# Patient Record
Sex: Male | Born: 1955 | Race: White | Hispanic: No | Marital: Married | Smoking: Former smoker
Health system: Southern US, Community
[De-identification: ages and names within clinical notes are randomized; demographics above are authoritative.]

## PROBLEM LIST (undated history)

## (undated) DIAGNOSIS — G510 Bell's palsy: Secondary | ICD-10-CM

## (undated) DIAGNOSIS — E7800 Pure hypercholesterolemia, unspecified: Secondary | ICD-10-CM

## (undated) DIAGNOSIS — I10 Essential (primary) hypertension: Secondary | ICD-10-CM

## (undated) DIAGNOSIS — K219 Gastro-esophageal reflux disease without esophagitis: Secondary | ICD-10-CM

## (undated) HISTORY — PX: SHOULDER SURGERY: SHX246

## (undated) HISTORY — PX: NASAL SINUS SURGERY: SHX719

## (undated) HISTORY — PX: INGUINAL HERNIA REPAIR: SHX194

---

## 2016-07-15 ENCOUNTER — Encounter (HOSPITAL_COMMUNITY): Payer: Self-pay | Admitting: Emergency Medicine

## 2016-07-15 ENCOUNTER — Observation Stay (HOSPITAL_COMMUNITY)
Admission: EM | Admit: 2016-07-15 | Discharge: 2016-07-16 | Disposition: A | Discharge status: Home / Self Care | Payer: BC Managed Care – PPO | Attending: Internal Medicine | Admitting: Internal Medicine

## 2016-07-15 ENCOUNTER — Emergency Department (HOSPITAL_COMMUNITY): Payer: BC Managed Care – PPO

## 2016-07-15 ENCOUNTER — Ambulatory Visit (HOSPITAL_COMMUNITY)
Admission: EM | Admit: 2016-07-15 | Discharge: 2016-07-15 | Disposition: A | Discharge status: Home / Self Care | Payer: BC Managed Care – PPO | Source: Home / Self Care | Attending: Family Medicine | Admitting: Family Medicine

## 2016-07-15 DIAGNOSIS — Z87891 Personal history of nicotine dependence: Secondary | ICD-10-CM | POA: Diagnosis not present

## 2016-07-15 DIAGNOSIS — R0789 Other chest pain: Secondary | ICD-10-CM | POA: Diagnosis present

## 2016-07-15 DIAGNOSIS — R079 Chest pain, unspecified: Secondary | ICD-10-CM | POA: Diagnosis not present

## 2016-07-15 DIAGNOSIS — N289 Disorder of kidney and ureter, unspecified: Secondary | ICD-10-CM | POA: Diagnosis not present

## 2016-07-15 DIAGNOSIS — I160 Hypertensive urgency: Secondary | ICD-10-CM | POA: Diagnosis present

## 2016-07-15 DIAGNOSIS — H9202 Otalgia, left ear: Secondary | ICD-10-CM | POA: Diagnosis not present

## 2016-07-15 DIAGNOSIS — E785 Hyperlipidemia, unspecified: Secondary | ICD-10-CM | POA: Diagnosis not present

## 2016-07-15 DIAGNOSIS — Z7982 Long term (current) use of aspirin: Secondary | ICD-10-CM | POA: Diagnosis not present

## 2016-07-15 DIAGNOSIS — Z79899 Other long term (current) drug therapy: Secondary | ICD-10-CM | POA: Diagnosis not present

## 2016-07-15 DIAGNOSIS — I10 Essential (primary) hypertension: Secondary | ICD-10-CM | POA: Diagnosis present

## 2016-07-15 HISTORY — DX: Bell's palsy: G51.0

## 2016-07-15 HISTORY — DX: Pure hypercholesterolemia, unspecified: E78.00

## 2016-07-15 HISTORY — DX: Essential (primary) hypertension: I10

## 2016-07-15 HISTORY — DX: Gastro-esophageal reflux disease without esophagitis: K21.9

## 2016-07-15 LAB — CBC
HCT: 40.5 % (ref 39.0–52.0)
HEMOGLOBIN: 13.5 g/dL (ref 13.0–17.0)
MCH: 31.3 pg (ref 26.0–34.0)
MCHC: 33.3 g/dL (ref 30.0–36.0)
MCV: 93.8 fL (ref 78.0–100.0)
PLATELETS: 233 10*3/uL (ref 150–400)
RBC: 4.32 MIL/uL (ref 4.22–5.81)
RDW: 12.9 % (ref 11.5–15.5)
WBC: 9 10*3/uL (ref 4.0–10.5)

## 2016-07-15 LAB — BASIC METABOLIC PANEL
Anion gap: 9 (ref 5–15)
BUN: 26 mg/dL — ABNORMAL HIGH (ref 6–20)
CHLORIDE: 100 mmol/L — AB (ref 101–111)
CO2: 25 mmol/L (ref 22–32)
Calcium: 8.9 mg/dL (ref 8.9–10.3)
Creatinine, Ser: 1.62 mg/dL — ABNORMAL HIGH (ref 0.61–1.24)
GFR calc Af Amer: 51 mL/min — ABNORMAL LOW (ref >60)
GFR calc non Af Amer: 44 mL/min — ABNORMAL LOW (ref >60)
GLUCOSE: 108 mg/dL — AB (ref 65–99)
Potassium: 3.8 mmol/L (ref 3.5–5.1)
Sodium: 134 mmol/L — ABNORMAL LOW (ref 135–145)

## 2016-07-15 LAB — TROPONIN I: Troponin I: <0.03 ng/mL (ref <0.03)

## 2016-07-15 LAB — I-STAT TROPONIN, ED: TROPONIN I, POC: 0 ng/mL (ref 0.00–0.08)

## 2016-07-15 MED ORDER — GI COCKTAIL ~~LOC~~: 30.0000 mL | Freq: Three times a day (TID) | ORAL | Status: DC | PRN

## 2016-07-15 MED ORDER — ONDANSETRON HCL 4 MG/2ML IJ SOLN: 4.0000 mg | Freq: Four times a day (QID) | INTRAMUSCULAR | Status: DC | PRN

## 2016-07-15 MED ORDER — ACETAMINOPHEN 325 MG PO TABS
650.0000 mg | ORAL_TABLET | ORAL | Status: DC | PRN
  Administered 2016-07-15: 650 mg via ORAL
  Filled 2016-07-15: qty 2

## 2016-07-15 MED ORDER — LABETALOL HCL 5 MG/ML IV SOLN: 5.0000 mg | INTRAVENOUS | Status: DC | PRN

## 2016-07-15 MED ORDER — MORPHINE SULFATE (PF) 4 MG/ML IV SOLN: 2.0000 mg | INTRAVENOUS | Status: DC | PRN

## 2016-07-15 MED ORDER — ENOXAPARIN SODIUM 40 MG/0.4ML ~~LOC~~ SOLN
40.0000 mg | SUBCUTANEOUS | Status: DC
  Administered 2016-07-15: 40 mg via SUBCUTANEOUS
  Filled 2016-07-15: qty 0.4

## 2016-07-15 MED ORDER — LISINOPRIL 40 MG PO TABS
40.0000 mg | ORAL_TABLET | Freq: Every day | ORAL | Status: DC
  Administered 2016-07-16: 40 mg via ORAL
  Filled 2016-07-15: qty 1

## 2016-07-15 MED ORDER — ONDANSETRON HCL 4 MG/2ML IJ SOLN
4.0000 mg | Freq: Four times a day (QID) | INTRAMUSCULAR | Status: DC | PRN
Start: 2016-07-15 — End: 2016-07-15

## 2016-07-15 MED ORDER — NITROGLYCERIN 0.4 MG SL SUBL: 0.4000 mg | SUBLINGUAL_TABLET | SUBLINGUAL | Status: DC | PRN

## 2016-07-15 MED ORDER — HYDROCODONE-ACETAMINOPHEN 5-325 MG PO TABS: 1.0000 | ORAL_TABLET | ORAL | Status: DC | PRN

## 2016-07-15 MED ORDER — ASPIRIN EC 81 MG PO TBEC
81.0000 mg | DELAYED_RELEASE_TABLET | Freq: Every day | ORAL | Status: DC
  Administered 2016-07-16: 81 mg via ORAL
  Filled 2016-07-15: qty 1

## 2016-07-15 MED ORDER — SODIUM CHLORIDE 0.9 % IV BOLUS (SEPSIS)
500.0000 mL | Freq: Once | INTRAVENOUS | Status: AC
  Administered 2016-07-15: 500 mL via INTRAVENOUS

## 2016-07-15 MED ORDER — HYDROCHLOROTHIAZIDE 25 MG PO TABS
25.0000 mg | ORAL_TABLET | Freq: Every day | ORAL | Status: DC
  Administered 2016-07-16: 25 mg via ORAL
  Filled 2016-07-15: qty 1

## 2016-07-15 MED ORDER — ACETAMINOPHEN 325 MG PO TABS: 650.0000 mg | ORAL_TABLET | Freq: Four times a day (QID) | ORAL | Status: DC | PRN

## 2016-07-15 MED ORDER — SODIUM CHLORIDE 0.9 % IV SOLN
INTRAVENOUS | Status: AC
  Administered 2016-07-15: 23:00:00 via INTRAVENOUS

## 2016-07-15 MED ORDER — EZETIMIBE 10 MG PO TABS
10.0000 mg | ORAL_TABLET | Freq: Every day | ORAL | Status: DC
  Administered 2016-07-16: 10 mg via ORAL
  Filled 2016-07-15: qty 1

## 2016-07-15 MED ORDER — FLUTICASONE PROPIONATE 50 MCG/ACT NA SUSP
1.0000 | NASAL | Status: DC | PRN
  Filled 2016-07-15: qty 16

## 2016-07-15 NOTE — ED Provider Notes (Signed)
[facility]-EMERGENCY DEPT Provider Note   CSN: [id number] Arrival date & time: 07/15/16  1753     History   Chief Complaint Chief Complaint  Patient presents with  . Chest Pain    HPI Earl Bryant is a 61 y.o. male.  Patient with obesity, high blood pressure, cholesterol history presents from urgent care for intermittent left jaw pain and chest pressure for the past week. Patient initially thought it was just due to wax in ears. Patient is had chest pressure since this morning and increased flow dyspnea/fatigue. Patient does work in Aeronautical engineer. No specific exertional chest pain. Patient does not have any known cardiac history. Patient has noticed more frequent events similar. No stress test recently. Patient had 6 baby aspirins today.      Past Medical History:  Diagnosis Date  . Acid reflux   . Bell's palsy   . High cholesterol   . Hypertension     There are no active problems to display for this patient.   Past Surgical History:  Procedure Laterality Date  . INGUINAL HERNIA REPAIR    . NASAL SINUS SURGERY    . SHOULDER SURGERY         Home Medications    Prior to Admission medications   Medication Sig Start Date End Date Taking? Authorizing Provider  aspirin 81 MG tablet Take 81 mg by mouth daily.   Yes [provider]  esomeprazole (NEXIUM) 20 MG capsule Take 20 mg by mouth as needed.    Yes [provider]  ezetimibe (ZETIA) 10 MG tablet Take 10 mg by mouth daily.   Yes [provider]  fluticasone (FLONASE) 50 MCG/ACT nasal spray Place 1 spray into both nostrils as needed for allergies or rhinitis.   Yes [provider]  hydrochlorothiazide (HYDRODIURIL) 25 MG tablet Take 25 mg by mouth daily.   Yes [provider]  LISINOPRIL PO Take 80 mg by mouth daily.    Yes [provider]  metroNIDAZOLE (METROGEL) 1 % gel Apply 1 application topically as needed.   Yes [provider]    Pseudoephedrine-Ibuprofen (ADVIL COLD & SINUS LIQUI-GELS PO) Take by mouth.   Yes [provider]  TADALAFIL PO Take 25 mg by mouth as needed.    Yes [provider]    Family History No family history on file.  Social History Social History  Substance Use Topics  . Smoking status: Former Games developer  . Smokeless tobacco: Never Used  . Alcohol use Yes     Comment: socially     Allergies   Patient has no known allergies.   Review of Systems Review of Systems  Constitutional: Positive for fatigue. Negative for chills and fever.  HENT: Negative for congestion.   Eyes: Negative for visual disturbance.  Respiratory: Negative for shortness of breath.   Cardiovascular: Positive for chest pain.  Gastrointestinal: Negative for abdominal pain and vomiting.  Genitourinary: Negative for dysuria and flank pain.  Musculoskeletal: Negative for back pain, neck pain and neck stiffness.  Skin: Negative for rash.  Neurological: Negative for light-headedness and headaches.     Physical Exam Updated Vital Signs BP (!) 170/103 (BP Location: Right Arm)   Pulse 91   Temp 97.6 F (36.4 C) (Oral)   Resp 18   Ht 6\' 1"  (1.854 m)   Wt 111.1 kg (245 lb)   SpO2 97%   BMI 32.32 kg/m   Physical Exam  Constitutional: He is oriented to person, place, and time.  He appears well-developed and well-nourished.  HENT:  Head: Normocephalic and atraumatic.  Eyes: Conjunctivae are normal. Right eye exhibits no discharge. Left eye exhibits no discharge.  Neck: Normal range of motion. Neck supple. No tracheal deviation present.  Cardiovascular: Normal rate, regular rhythm and intact distal pulses.   Pulmonary/Chest: Effort normal and breath sounds normal.  Abdominal: Soft. He exhibits no distension. There is no tenderness. There is no guarding.  Musculoskeletal: He exhibits no edema.  Neurological: He is alert and oriented to person, place, and time.  Skin: Skin is warm. No rash noted.   Psychiatric: He has a normal mood and affect.  Nursing note and vitals reviewed.    ED Treatments / Results  Labs (all labs ordered are listed, but only abnormal results are displayed) Labs Reviewed  BASIC METABOLIC PANEL - Abnormal; Notable for the following:       Result Value   Sodium 134 (*)    Chloride 100 (*)    Glucose, Bld 108 (*)    BUN 26 (*)    Creatinine, Ser 1.62 (*)    GFR calc non Af Amer 44 (*)    GFR calc Af Amer 51 (*)    All other components within normal limits  CBC  I-STAT TROPOININ, ED    EKG  EKG Interpretation None       Radiology Dg Chest 2 View  Result Date: 07/15/2016 CLINICAL DATA:  Intermittent pain at LEFT-sided jaw into LEFT neck and LEFT-sided chest beginning 2 weeks ago, hypertension, former smoker EXAM: CHEST  2 VIEW COMPARISON:  10/11/2007 FINDINGS: Normal heart size, mediastinal contours, and pulmonary vascularity. Bronchitic changes with minimal RIGHT basilar atelectasis. Lungs otherwise clear. No acute infiltrate, pleural effusion or pneumothorax. Bones unremarkable. IMPRESSION: Bronchitic changes with RIGHT basilar atelectasis. Electronically Signed   By: Ulyses Southward M.D.   On: 07/15/2016 18:27    Procedures Procedures (including critical care time)  Medications Ordered in ED Medications  sodium chloride 0.9 % bolus 500 mL (not administered)     Initial Impression / Assessment and Plan / ED Course  I have reviewed the triage vital signs and the nursing notes.  Pertinent labs & imaging results that were available during my care of the patient were reviewed by me and considered in my medical decision making (see chart for details).    Patient presents with intermittent chest pressure and left jaw symptoms. Patient has multiple risk factors for coronary artery disease. Discussed observation in the hospital for likely stress test versus close outpatient follow-up. We agreed on observation.  The patients results and plan were  reviewed and discussed.   Any x-rays performed were independently reviewed by myself.   Differential diagnosis were considered with the presenting HPI.  Medications  0.9 %  sodium chloride infusion ( Intravenous Rate/Dose Verify 07/16/16 0527)  sodium chloride 0.9 % bolus 500 mL (0 mLs Intravenous Stopped 07/15/16 2100)  technetium tetrofosmin (TC-MYOVIEW) injection 10 millicurie (10 millicuries Intravenous Contrast Given 07/16/16 1202)  technetium tetrofosmin (TC-MYOVIEW) injection 30 millicurie (30 millicuries Intravenous Contrast Given 07/16/16 1306)    Vitals:   07/15/16 2100 07/16/16 0612 07/16/16 0945 07/16/16 1446  BP: (!) 153/82 (!) 152/89 140/83 131/88  Pulse: 88 63  80  Resp: 18 16  18   Temp: 98.2 F (36.8 C) 97.5 F (36.4 C)  98.4 F (36.9 C)  TempSrc: Oral Oral  Oral  SpO2: 100% 98%  100%  Weight: 111.9 kg (246 lb 12.8 oz) 111.8 kg (246  lb 6.4 oz)    Height: 6\' 1"  (1.854 m)       Final diagnoses:  Acute chest pain    Admission/ observation were discussed with the admitting physician, patient and/or family and they are comfortable with the plan.    Final Clinical Impressions(s) / ED Diagnoses   Final diagnoses:  Acute chest pain    New Prescriptions New Prescriptions   No medications on file     Blane OharaZavitz, Zuhair Lariccia, MD 07/18/16 [phone]

## 2016-07-15 NOTE — ED Notes (Signed)
Dinner tray ordered; heart healthy diet 

## 2016-07-15 NOTE — ED Triage Notes (Signed)
Pt sent by urgent care for evaluation of chest pain radiating to left jaw and left ear. Pt states pain has been intermittent for "a few weeks". Pt states he normally has excessive wax in ears that sometimes causes these symptoms.

## 2016-07-15 NOTE — H&P (Signed)
History and Physical    Earl Bryant ZOX:[id number] DOB: Sep 01, 1955 DOA: 07/15/2016  PCP: Lindwood Qua, MD   Patient coming from: Home, by way of urgent care  Chief Complaint: Left ear and jaw ache, chest pain   HPI: Earl Bryant is a 60 y.o. male with medical history significant for hypertension, hyperlipidemia, and history of tobacco abuse, now presenting to the emergency department for evaluation of left ear and jaw ache with chest discomfort. Patient reports that he bit his usual state of health until one to 2 weeks ago when he noted the insidious development of a dull ache in his left ear and left jaw. He reports experiencing similar symptoms previously admitted been attributed to excessive cerumen, but has also begun to develop chest discomfort associated with the jaw ache. He has been unable to identify any alleviating or exacerbating factors for these symptoms, but notes the intermittent and recurring development of a dull ache in the left jaw, moderate in intensity, and nonradiating. The chest discomfort is also described as dull, aching, localized to the mid-left chest, and moderate in intensity. He went to urgent care for evaluation of this, but was directed over to the ED for evaluation of potential ACS. Patient reports taking 6 tabs of aspirin 81 mg today. He denies cough or dyspnea. Denies fevers or chills. Reports occasional indigestion for which she takes Nexium as needed, but notes that his current symptoms are different than the experiences with indigestion.  ED Course: Upon arrival to the ED, patient is found to be afebrile, saturating well on room air, hypertensive to 170/103, and with vitals otherwise stable. EKG features a sinus rhythm with PVC and chest x-ray is notable for bronchitic changes with right base atelectasis. Chemistry panel is notable for slight hyponatremia, BUN of 26, and serum creatinine 1.6 to with unknown baseline. CBC is unremarkable and troponin is  undetectable. Patient was treated with 500 mL normal saline in the ED and remained stable. He will be observed on the telemetry unit for ongoing evaluation and management of left jaw and chest discomfort in a patient with risk factors for CAD.  Review of Systems:  All other systems reviewed and apart from HPI, are negative.  Past Medical History:  Diagnosis Date  . Acid reflux   . Bell's palsy   . High cholesterol   . Hypertension     Past Surgical History:  Procedure Laterality Date  . INGUINAL HERNIA REPAIR    . NASAL SINUS SURGERY    . SHOULDER SURGERY       reports that he has quit smoking. His smoking use included Cigarettes. He has never used smokeless tobacco. He reports that he drinks alcohol. He reports that he does not use drugs.  No Known Allergies  Family History  Problem Relation Age of Onset  . Hypertension Mother   . Heart attack Father   . Hypertension Sister      Prior to Admission medications   Medication Sig Start Date End Date Taking? Authorizing Provider  aspirin 81 MG tablet Take 81 mg by mouth daily.   Yes [provider]  esomeprazole (NEXIUM) 20 MG capsule Take 20 mg by mouth as needed.    Yes [provider]  ezetimibe (ZETIA) 10 MG tablet Take 10 mg by mouth daily.   Yes [provider]  fluticasone (FLONASE) 50 MCG/ACT nasal spray Place 1 spray into both nostrils as needed for allergies or rhinitis.   Yes [provider]  hydrochlorothiazide (  HYDRODIURIL) 25 MG tablet Take 25 mg by mouth daily.   Yes [provider]  LISINOPRIL PO Take 80 mg by mouth daily.    Yes [provider]  metroNIDAZOLE (METROGEL) 1 % gel Apply 1 application topically as needed.   Yes [provider]  Pseudoephedrine-Ibuprofen (ADVIL COLD & SINUS LIQUI-GELS PO) Take by mouth.   Yes [provider]  TADALAFIL PO Take 25 mg by mouth as needed.    Yes [provider]    Physical Exam: Vitals:    07/15/16 1802 07/15/16 2045  BP: (!) 170/103 (!) 159/90  Pulse: 91 75  Resp: 18   Temp: 97.6 F (36.4 C)   TempSrc: Oral   SpO2: 97% 99%  Weight: 111.1 kg (245 lb)   Height: 6\' 1"  (1.854 m)       Constitutional: NAD, calm, comfortable Eyes: PERTLA, lids and conjunctivae normal ENMT: Mucous membranes are moist. Posterior pharynx clear of any exudate or lesions.   Neck: normal, supple, no masses, no thyromegaly Respiratory: clear to auscultation bilaterally, no wheezing, no crackles. Normal respiratory effort.   Cardiovascular: S1 & S2 heard, regular rate and rhythm. No extremity edema. No significant JVD. Abdomen: No distension, no tenderness, no masses palpated. Bowel sounds normal.  Musculoskeletal: no clubbing / cyanosis. No joint deformity upper and lower extremities.    Skin: no significant rashes, lesions, ulcers. Warm, dry, well-perfused. Neurologic: CN 2-12 grossly intact. Sensation intact, DTR normal. Strength 5/5 in all 4 limbs.  Psychiatric: Alert and oriented x 3. Pleasant and cooperative.     Labs on Admission: I have personally reviewed following labs and imaging studies  CBC:  Recent Labs Lab 07/15/16 1800  WBC 9.0  HGB 13.5  HCT 40.5  MCV 93.8  PLT 233   Basic Metabolic Panel:  Recent Labs Lab 07/15/16 1800  NA 134*  K 3.8  CL 100*  CO2 25  GLUCOSE 108*  BUN 26*  CREATININE 1.62*  CALCIUM 8.9   GFR: Estimated Creatinine Clearance: 62.6 mL/min (A) (by C-G formula based on SCr of 1.62 mg/dL (H)). Liver Function Tests: No results for input(s): AST, ALT, ALKPHOS, BILITOT, PROT, ALBUMIN in the last 168 hours. No results for input(s): LIPASE, AMYLASE in the last 168 hours. No results for input(s): AMMONIA in the last 168 hours. Coagulation Profile: No results for input(s): INR, PROTIME in the last 168 hours. Cardiac Enzymes: No results for input(s): CKTOTAL, CKMB, CKMBINDEX, TROPONINI in the last 168 hours. BNP (last 3 results) No results  for input(s): PROBNP in the last 8760 hours. HbA1C: No results for input(s): HGBA1C in the last 72 hours. CBG: No results for input(s): GLUCAP in the last 168 hours. Lipid Profile: No results for input(s): CHOL, HDL, LDLCALC, TRIG, CHOLHDL, LDLDIRECT in the last 72 hours. Thyroid Function Tests: No results for input(s): TSH, T4TOTAL, FREET4, T3FREE, THYROIDAB in the last 72 hours. Anemia Panel: No results for input(s): VITAMINB12, FOLATE, FERRITIN, TIBC, IRON, RETICCTPCT in the last 72 hours. Urine analysis: No results found for: COLORURINE, APPEARANCEUR, LABSPEC, PHURINE, GLUCOSEU, HGBUR, BILIRUBINUR, KETONESUR, PROTEINUR, UROBILINOGEN, NITRITE, LEUKOCYTESUR Sepsis Labs: @LABRCNTIP (procalcitonin:4,lacticidven:4) )No results found for this or any previous visit (from the past 240 hour(s)).   Radiological Exams on Admission: Dg Chest 2 View  Result Date: 07/15/2016 CLINICAL DATA:  Intermittent pain at LEFT-sided jaw into LEFT neck and LEFT-sided chest beginning 2 weeks ago, hypertension, former smoker EXAM: CHEST  2 VIEW COMPARISON:  10/11/2007 FINDINGS: Normal heart size, mediastinal contours, and  pulmonary vascularity. Bronchitic changes with minimal RIGHT basilar atelectasis. Lungs otherwise clear. No acute infiltrate, pleural effusion or pneumothorax. Bones unremarkable. IMPRESSION: Bronchitic changes with RIGHT basilar atelectasis. Electronically Signed   By: Ulyses Southward M.D.   On: 07/15/2016 18:27    EKG: Independently reviewed. Sinus rhythm, PVC.   Assessment/Plan  1. Chest discomfort  - Pt presents with vague intermittent ache in left ear, left jaw, and chest  - Has recurrent cerumen impaction which has caused similar jaw ache in the past, but the chest discomfort is new  - Risk factors for CAD include smoking hx, HTN, HLD, FHx  - Took ASA pta  - ED workup is reassuring with undetectable troponin and EKG with acute ischemic features  - Plan to monitor on telemetry for ischemic  changes, obtain serial troponin measurements, repeat EKG in am and prn, continue lisinopril, use beta-blocker now for associated hypertensive urgency, check lipid panel, continue Zetia    2. Hypertension with hypertensive urgency  - Pt has hx of HTN managed at home with lisinopril 80 mg qD and HCTZ  - Plan to continue lisinopril at 40 mg qD, continue HCTZ  - BP persisting in 170/100 range in ED and beta-blocker IVP's will be used prn    3. Renal insufficiency, unknown chronicity  - SCr is 1.62 on admission with unknown baseline  - Plan to provide an IVF hydration overnight, reduce lisinopril from 80 to 40 mg qD, repeat chem panel in am    4. Hyperlipidemia  - No lipid panel on file, will check in order to better risk-stratify in terms of #1 above  - Continue Zetia, consider starting statin given his age, HTN, and smoking hx    DVT prophylaxis: sq Lovenox Code Status: Full  Family Communication: Discussed with patient Disposition Plan: Observe on telemetry Consults called: None Admission status: Observation    Briscoe Deutscher, MD [facility] Pager [phone]  If 7PM-7AM, please contact night-coverage [email or web address] Password [facility]  07/15/2016, 8:57 PM

## 2016-07-15 NOTE — ED Triage Notes (Signed)
Left ear pain, annoying pain, dull pain.  Slight decrease in hearing .    In may had pneumonia.   A few weeks ago reports ear wax came out of ear.

## 2016-07-15 NOTE — ED Notes (Signed)
During physician exam patient reported left sided jaw pain and intermittent chest pain with tiredness x 2 weeks. Patient instructed to be seen in the emergency department to rule out cardiac events. Patient verbalized understanding and agreeable to POC.

## 2016-07-16 ENCOUNTER — Observation Stay (HOSPITAL_BASED_OUTPATIENT_CLINIC_OR_DEPARTMENT_OTHER): Payer: BC Managed Care – PPO

## 2016-07-16 ENCOUNTER — Encounter (HOSPITAL_COMMUNITY): Payer: Self-pay | Admitting: Cardiology

## 2016-07-16 DIAGNOSIS — R072 Precordial pain: Secondary | ICD-10-CM | POA: Diagnosis not present

## 2016-07-16 DIAGNOSIS — E782 Mixed hyperlipidemia: Secondary | ICD-10-CM

## 2016-07-16 DIAGNOSIS — Z87891 Personal history of nicotine dependence: Secondary | ICD-10-CM | POA: Diagnosis not present

## 2016-07-16 DIAGNOSIS — R079 Chest pain, unspecified: Secondary | ICD-10-CM | POA: Diagnosis not present

## 2016-07-16 DIAGNOSIS — R0789 Other chest pain: Secondary | ICD-10-CM | POA: Diagnosis not present

## 2016-07-16 DIAGNOSIS — Z7982 Long term (current) use of aspirin: Secondary | ICD-10-CM | POA: Diagnosis not present

## 2016-07-16 DIAGNOSIS — Z79899 Other long term (current) drug therapy: Secondary | ICD-10-CM | POA: Diagnosis not present

## 2016-07-16 DIAGNOSIS — I160 Hypertensive urgency: Secondary | ICD-10-CM | POA: Diagnosis not present

## 2016-07-16 DIAGNOSIS — I10 Essential (primary) hypertension: Secondary | ICD-10-CM | POA: Diagnosis not present

## 2016-07-16 LAB — BASIC METABOLIC PANEL
Anion gap: 5 (ref 5–15)
BUN: 21 mg/dL — AB (ref 6–20)
CHLORIDE: 105 mmol/L (ref 101–111)
CO2: 26 mmol/L (ref 22–32)
CREATININE: 1.44 mg/dL — AB (ref 0.61–1.24)
Calcium: 8.7 mg/dL — ABNORMAL LOW (ref 8.9–10.3)
GFR calc non Af Amer: 51 mL/min — ABNORMAL LOW (ref >60)
GFR, EST AFRICAN AMERICAN: 59 mL/min — AB (ref >60)
Glucose, Bld: 94 mg/dL (ref 65–99)
POTASSIUM: 4.3 mmol/L (ref 3.5–5.1)
Sodium: 136 mmol/L (ref 135–145)

## 2016-07-16 LAB — CBC
HEMATOCRIT: 38.5 % — AB (ref 39.0–52.0)
Hemoglobin: 12.9 g/dL — ABNORMAL LOW (ref 13.0–17.0)
MCH: 31.1 pg (ref 26.0–34.0)
MCHC: 33.5 g/dL (ref 30.0–36.0)
MCV: 92.8 fL (ref 78.0–100.0)
PLATELETS: 203 10*3/uL (ref 150–400)
RBC: 4.15 MIL/uL — AB (ref 4.22–5.81)
RDW: 12.8 % (ref 11.5–15.5)
WBC: 5.7 10*3/uL (ref 4.0–10.5)

## 2016-07-16 LAB — TROPONIN I: Troponin I: <0.03 ng/mL (ref <0.03)

## 2016-07-16 LAB — HIV ANTIBODY (ROUTINE TESTING W REFLEX): HIV Screen 4th Generation wRfx: NONREACTIVE

## 2016-07-16 LAB — LIPID PANEL
CHOL/HDL RATIO: 3.4 ratio
Cholesterol: 149 mg/dL (ref 0–200)
HDL: 44 mg/dL (ref >40)
LDL Cholesterol: 76 mg/dL (ref 0–99)
Triglycerides: 143 mg/dL (ref <150)
VLDL: 29 mg/dL (ref 0–40)

## 2016-07-16 LAB — NM MYOCAR MULTI W/SPECT W/WALL MOTION / EF
CSEPEDS: 0 s
CSEPHR: 97 %
CSEPPHR: 155 {beats}/min
Estimated workload: 7 METS
Exercise duration (min): 6 min
MPHR: 159 {beats}/min
Rest HR: 61 {beats}/min

## 2016-07-16 MED ORDER — TECHNETIUM TC 99M TETROFOSMIN IV KIT
30.0000 | PACK | Freq: Once | INTRAVENOUS | Status: AC | PRN
  Administered 2016-07-16: 30 via INTRAVENOUS

## 2016-07-16 MED ORDER — LISINOPRIL 40 MG PO TABS: 40.0000 mg | ORAL_TABLET | Freq: Every day | ORAL | 0 refills | Status: AC

## 2016-07-16 MED ORDER — TECHNETIUM TC 99M TETROFOSMIN IV KIT
10.0000 | PACK | Freq: Once | INTRAVENOUS | Status: AC | PRN
  Administered 2016-07-16: 10 via INTRAVENOUS

## 2016-07-16 NOTE — Discharge Summary (Signed)
Physician Discharge Summary  Earl Bryant ZOX:[id number] DOB: 10/23/1955 DOA: 07/15/2016  PCP: Lindwood Qua, MD  Admit date: 07/15/2016 Discharge date: 07/16/2016  Time spent: 65 minutes  Recommendations for Outpatient Follow-up:  1. Follow-up with Lindwood Qua, MD in 2 weeks. On follow-up patient's chest discomfort when he to be reassessed and may need GI workup if continued chest discomfort. Patient will need a basic metabolic profile done to follow-up on electrolytes and renal function. Patient's blood pressure need to be assessed. 2.    Discharge Diagnoses:  Principal Problem:   Chest pain Active Problems:   Essential hypertension   Hyperlipidemia   Former smoker, stopped smoking in distant past   Renal insufficiency   Hypertensive urgency   Discharge Condition: Stable and improved  Diet recommendation: Heart healthy  Filed Weights   07/15/16 1802 07/15/16 2100 07/16/16 0612  Weight: 111.1 kg (245 lb) 111.9 kg (246 lb 12.8 oz) 111.8 kg (246 lb 6.4 oz)    History of present illness:  Earl Bryant is a 61 y.o. male with medical history significant for hypertension, hyperlipidemia, and history of tobacco abuse, now presenting to the emergency department for evaluation of left ear and jaw ache with chest discomfort. Patient reported that he was in his usual state of health until one to 2 weeks ago when he noted the insidious development of a dull ache in his left ear and left jaw. He reported experiencing similar symptoms previously admitted been attributed to excessive cerumen, but has also begun to develop chest discomfort associated with the jaw ache. He has been unable to identify any alleviating or exacerbating factors for these symptoms, but noted the intermittent and recurring development of a dull ache in the left jaw, moderate in intensity, and nonradiating. The chest discomfort was also described as dull, aching, localized to the mid-left chest, and moderate in intensity.  He went to urgent care for evaluation of this, but was directed over to the ED for evaluation of potential ACS. Patient reported taking 6 tabs of aspirin 81 mg today. He denied cough or dyspnea. Denied fevers or chills. Reported occasional indigestion for which she takes Nexium as needed, but notes that his current symptoms are different than the experiences with indigestion.  ED Course: Upon arrival to the ED, patient is found to be afebrile, saturating well on room air, hypertensive to 170/103, and with vitals otherwise stable. EKG features a sinus rhythm with PVC and chest x-ray is notable for bronchitic changes with right base atelectasis. Chemistry panel is notable for slight hyponatremia, BUN of 26, and serum creatinine 1.6 to with unknown baseline. CBC is unremarkable and troponin is undetectable. Patient was treated with 500 mL normal saline in the ED and remained stable. He will be observed on the telemetry unit for ongoing evaluation and management of left jaw and chest discomfort in a patient with risk factors for CAD.  Hospital Course:  1. Chest discomfort/Chest Pain  - Pt presented with vague intermittent ache in left ear, left jaw, and chest pain. - Has recurrent cerumen impaction which has caused similar jaw ache in the past, but the chest discomfort was new . - Patient noted to have multiple risk factors for coronary artery disease include hypertension, hyperlipidemia, tobacco abuse, family history. Patient had taken aspirin prior to admission. - Cardiac enzymes cycled were negative 3. EKG showed a normal sinus rhythm. Chest x-ray showed bronchitic changes with bibasilar atelectasis. - Due to patient's risk factors cardiology consultation was obtained and patient underwent  a exercise Myoview stress study. -   Myoview stress study was a normal study with low risk EF of 55%. - Patient did not have any further chest discomfort or chest pain.  - patient be discharged in stable and improved  condition with close outpatient follow-up with PCP.  2. Hypertension with hypertensive urgency  - Pt has hx of HTN managed at home with lisinopril 80 mg qD and HCTZ  - HCTZ was continued and lisinopril resumed at a lower dose of 40 mg daily. Outpatient follow-up.   3. Renal insufficiency, unknown chronicity  - SCr is 1.62 on admission with unknown baseline  - Patient was hydrated and lisinopril reduced from 80 to 40 mg qD. Renal function improved and was 1.44 by day of discharge. Outpatient follow up.  4. Hyperlipidemia  - Fasting lipid panel done had LDL of 76.  - Continued on Zetia.   Procedures:  Exercise Myoview stress test several 1 2018  CXR 07/15/2016  Consultations:  Cardiology: Dr. Diona BrownerMcDowell 07/16/2016  Discharge Exam: Vitals:   07/16/16 0945 07/16/16 1446  BP: 140/83 131/88  Pulse:  80  Resp:  18  Temp:  98.4 F (36.9 C)    General: NAD Cardiovascular: RRR Respiratory: CTAB  Discharge Instructions   Discharge Instructions    Diet - low sodium heart healthy    Complete by:  As directed    Increase activity slowly    Complete by:  As directed      Current Discharge Medication List    CONTINUE these medications which have CHANGED   Details  lisinopril (PRINIVIL,ZESTRIL) 40 MG tablet Take 1 tablet (40 mg total) by mouth daily. Qty: 30 tablet, Refills: 0      CONTINUE these medications which have NOT CHANGED   Details  aspirin 81 MG tablet Take 81 mg by mouth daily.    esomeprazole (NEXIUM) 20 MG capsule Take 20 mg by mouth as needed.     ezetimibe (ZETIA) 10 MG tablet Take 10 mg by mouth daily.    fluticasone (FLONASE) 50 MCG/ACT nasal spray Place 1 spray into both nostrils as needed for allergies or rhinitis.    hydrochlorothiazide (HYDRODIURIL) 25 MG tablet Take 25 mg by mouth daily.    metroNIDAZOLE (METROGEL) 1 % gel Apply 1 application topically as needed.    Pseudoephedrine-Ibuprofen (ADVIL COLD & SINUS LIQUI-GELS PO) Take by mouth.     TADALAFIL PO Take 25 mg by mouth as needed.        No Known Allergies Follow-up Information    Lindwood QuaHoffman, Byron, MD. Schedule an appointment as soon as possible for a visit in 2 week(s).   Specialty:  Internal Medicine Contact information: [address] [place] [address] [phone]            The results of significant diagnostics from this hospitalization (including imaging, microbiology, ancillary and laboratory) are listed below for reference.    Significant Diagnostic Studies: Dg Chest 2 View  Result Date: 07/15/2016 CLINICAL DATA:  Intermittent pain at LEFT-sided jaw into LEFT neck and LEFT-sided chest beginning 2 weeks ago, hypertension, former smoker EXAM: CHEST  2 VIEW COMPARISON:  10/11/2007 FINDINGS: Normal heart size, mediastinal contours, and pulmonary vascularity. Bronchitic changes with minimal RIGHT basilar atelectasis. Lungs otherwise clear. No acute infiltrate, pleural effusion or pneumothorax. Bones unremarkable. IMPRESSION: Bronchitic changes with RIGHT basilar atelectasis. Electronically Signed   By: Ulyses SouthwardMark  Boles M.D.   On: 07/15/2016 18:27   Nm Myocar Multi W/spect  W/wall Motion / Ef  Result Date: 07/16/2016  The patient walked for 5:59 of a Bruce protocol GXT. Peak Hr was 155 which is 97% predicted maximal HR. There were no ST orT wave changes to suggest ischemia  Blood pressure demonstrated a hypertensive response to exercise.  The study is normal.  This is a low risk study.  The left ventricular ejection fraction is normal (55-65%).  Nuclear stress EF: 55%.     Microbiology: No results found for this or any previous visit (from the past 240 hour(s)).   Labs: Basic Metabolic Panel:  Recent Labs Lab 07/15/16 1800 07/16/16 0846  NA 134* 136  K 3.8 4.3  CL 100* 105  CO2 25 26  GLUCOSE 108* 94  BUN 26* 21*  CREATININE 1.62* 1.44*  CALCIUM 8.9 8.7*   Liver Function Tests: No results for input(s): AST, ALT, ALKPHOS,  BILITOT, PROT, ALBUMIN in the last 168 hours. No results for input(s): LIPASE, AMYLASE in the last 168 hours. No results for input(s): AMMONIA in the last 168 hours. CBC:  Recent Labs Lab 07/15/16 1800 07/16/16 0846  WBC 9.0 5.7  HGB 13.5 12.9*  HCT 40.5 38.5*  MCV 93.8 92.8  PLT 233 203   Cardiac Enzymes:  Recent Labs Lab 07/15/16 2130 07/16/16 0354 07/16/16 0846  TROPONINI <0.03 <0.03 <0.03   BNP: BNP (last 3 results) No results for input(s): BNP in the last 8760 hours.  ProBNP (last 3 results) No results for input(s): PROBNP in the last 8760 hours.  CBG: No results for input(s): GLUCAP in the last 168 hours.     SignedRamiro Harvest MD.  [facility] 07/16/2016, 5:18 PM

## 2016-07-16 NOTE — Discharge Instructions (Signed)
Chest Wall Pain °Chest wall pain is pain in or around the bones and muscles of your chest. Sometimes, an injury causes this pain. Sometimes, the cause may not be known. This pain may take several weeks or longer to get better. °Follow these instructions at home: °Pay attention to any changes in your symptoms. Take these actions to help with your pain: °· Rest as told by your doctor. °· Avoid activities that cause pain. Try not to use your chest, belly (abdominal), or side muscles to lift heavy things. °· If directed, apply ice to the painful area: °? Put ice in a plastic bag. °? Place a towel between your skin and the bag. °? Leave the ice on for 20 minutes, 2-3 times per day. °· Take over-the-counter and prescription medicines only as told by your doctor. °· Do not use tobacco products, including cigarettes, chewing tobacco, and e-cigarettes. If you need help quitting, ask your doctor. °· Keep all follow-up visits as told by your doctor. This is important. ° °Contact a doctor if: °· You have a fever. °· Your chest pain gets worse. °· You have new symptoms. °Get help right away if: °· You feel sick to your stomach (nauseous) or you throw up (vomit). °· You feel sweaty or light-headed. °· You have a cough with phlegm (sputum) or you cough up blood. °· You are short of breath. °This information is not intended to replace advice given to you by your health care provider. Make sure you discuss any questions you have with your health care provider. °Document Released: 06/21/2007 Document Revised: 06/10/2015 Document Reviewed: 03/30/2014 °Elsevier Interactive Patient Education © 2018 Elsevier Inc. ° °

## 2016-07-16 NOTE — Progress Notes (Signed)
   Janett Billowodd Kinnaird presented for an exercise cardiolite today.  No immediate complications.  Stress imaging is pending at this time.  Nicolasa Duckinghristopher Saron Vanorman, NP 07/16/2016, 1:19 PM

## 2016-07-16 NOTE — Consult Note (Signed)
Cardiology Consultation:   Patient ID: Earl Bryant; [id number]; Oct 01, 1955   Admit date: 07/15/2016 Date of Consult: 07/16/2016  Primary Care Provider: Lindwood Qua, MD Primary Cardiologist: new   Patient Profile:   Earl Bryant is a 61 y.o. male with a hx of HTN, HLD, tobacco use who is being seen today for the evaluation of Cheat pain  at the request of Dr. Janee Morn.  History of Present Illness:   Earl Bryant with hx of tobacco use, HTN and HLD presented to ER 07/15/16 with Lt ear and jaw ache.  He also complained of chest discomfort.  Symptoms of Lt ear ache began 2 weeks ago, dull ache in Lt ear and jaw.  He has had ear ache in past with excessive cerumen, but chest discomfort was new.   In ER pt was hypertensive to 170-103.  Pt given NS 500cc and remained stable. Pt admitted to rule out. MI.   EKG SR with PVC   CXR  Bronchitic changes with RIGHT basilar atelectasis Troponin 0.00 to <0.03 all neg for MI LDL is 76, HDL 44, TG 143 and TChol 149  Hgb is 12.9,  Cr 1.44, BUN 21 and K+ 4.3    Currently no discomfort though still some in Lt ear.  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   He lives in [place] and works in [place].  He was in [place] and just kept feeling worse yesterday.   He went to 2 urgent care centers but they had closed so came to ER.  He has had ETT 11 years ago with chest pain but neg and was due to GERD.       Past Medical History:  Diagnosis Date  . Acid reflux   . Bell's palsy   . High cholesterol   . Hypertension     Past Surgical History:  Procedure Laterality Date  . INGUINAL HERNIA REPAIR    . NASAL SINUS SURGERY    . SHOULDER SURGERY       Inpatient Medications: Scheduled Meds: . aspirin EC  81 mg Oral Daily  . enoxaparin (LOVENOX) injection  40 mg Subcutaneous Q24H  . ezetimibe  10 mg Oral Daily  . hydrochlorothiazide  25 mg Oral Daily  . lisinopril  40 mg Oral Daily    Continuous Infusions:  PRN Meds: acetaminophen, fluticasone, gi cocktail, HYDROcodone-acetaminophen, labetalol, morphine injection, nitroGLYCERIN, ondansetron (ZOFRAN) IV  Allergies:   No Known Allergies  Social History:   Social History   Social History  . Marital status: Married    Spouse name: N/A  . Number of children: N/A  . Years of education: N/A   Occupational History  . Not on file.   Social History Main Topics  . Smoking status: Former Smoker    Types: Cigarettes  . Smokeless tobacco: Never Used     Comment: quit in early 2000's  . Alcohol use Yes     Comment: socially  . Drug use: No  . Sexual activity: Yes   Other Topics Concern  . Not on file   Social History Narrative  . No narrative on file    Family History:   The patient's family history includes Arrhythmia in his father; Heart attack in his father; Hypertension in his brother, maternal aunt, mother, and sister.  ROS:  Please see the history of present illness.  ROS  General:no colds or fevers, no weight changes  Skin:no rashes or ulcers HEENT:no blurred vision, no congestion CV:see HPI PUL:see HPI GI:no diarrhea constipation or melena, no indigestion GU:no hematuria, no dysuria MS:no joint pain, no claudication Neuro:no syncope, no lightheadedness Endo:no diabetes, no thyroid disease   Physical Exam/Data:   Vitals:   07/15/16 2045 07/15/16 2100 07/16/16 0612 07/16/16 0945  BP: (!) 159/90 (!) 153/82 (!) 152/89 140/83  Pulse: 75 88 63   Resp:  18 16   Temp:  98.2 F (36.8 C) 97.5 F (36.4 C)   TempSrc:  Oral Oral   SpO2: 99% 100% 98%   Weight:  246 lb 12.8 oz (111.9 kg) 246 lb 6.4 oz (111.8 kg)   Height:  6\' 1"  (1.854 m)      Intake/Output Summary (Last 24 hours) at 07/16/16 1113 Last data filed at 07/16/16 0527  Gross per 24 hour  Intake              968 ml  Output              725 ml  Net              243 ml   Filed Weights   07/15/16 1802 07/15/16 2100 07/16/16 0612   Weight: 245 lb (111.1 kg) 246 lb 12.8 oz (111.9 kg) 246 lb 6.4 oz (111.8 kg)   Body mass index is 32.51 kg/m.  General:  Well nourished, well developed, in no acute distress HEENT: normal Lymph: no adenopathy Neck: no JVD Endocrine:  No thryomegaly Vascular: No carotid bruits; 2+ pedal pulses Bil and post tib. Cardiac:  normal S1, S2; RRR; no murmur gallup rub or click Lungs:  clear to auscultation bilaterally, no wheezing, rhonchi or rales  Abd: soft, nontender, no hepatomegaly  Ext: no edema Musculoskeletal:  No deformities, BUE and BLE strength normal and equal Skin: warm and dry  Neuro:  A&O X 3 MAE, follows commands, + facial symmetry   Psych:  Normal affect   EKG:  The EKG was personally reviewed and demonstrates:  See above Telemetry:  Telemetry was personally reviewed and demonstrates: SR  Relevant CV Studies: none  Laboratory Data:  Chemistry  Recent Labs Lab 07/15/16 1800 07/16/16 0846  NA 134* 136  K 3.8 4.3  CL 100* 105  CO2 25 26  GLUCOSE 108* 94  BUN 26* 21*  CREATININE 1.62* 1.44*  CALCIUM 8.9 8.7*  GFRNONAA 44* 51*  GFRAA 51* 59*  ANIONGAP 9 5    No results for input(s): PROT, ALBUMIN, AST, ALT, ALKPHOS, BILITOT in the last 168 hours. Hematology  Recent Labs Lab 07/15/16 1800 07/16/16 0846  WBC 9.0 5.7  RBC 4.32 4.15*  HGB 13.5 12.9*  HCT 40.5 38.5*  MCV 93.8 92.8  MCH 31.3 31.1  MCHC 33.3 33.5  RDW 12.9 12.8  PLT 233 203   Cardiac Enzymes  Recent Labs Lab 07/15/16 2130 07/16/16 0354 07/16/16 0846  TROPONINI <0.03 <0.03 <0.03     Recent Labs Lab 07/15/16 1816  TROPIPOC 0.00    BNPNo results for input(s): BNP, PROBNP in the last 168 hours.  DDimer No results for input(s): DDIMER in the last 168 hours.  Radiology/Studies:  Dg Chest 2 View  Result Date: 07/15/2016 CLINICAL DATA:  Intermittent pain at LEFT-sided jaw into LEFT neck and LEFT-sided chest beginning 2 weeks ago, hypertension, former smoker EXAM: CHEST  2 VIEW  COMPARISON:  10/11/2007 FINDINGS: Normal heart size, mediastinal contours, and pulmonary vascularity. Bronchitic changes with minimal RIGHT basilar atelectasis. Lungs  otherwise clear. No acute infiltrate, pleural effusion or pneumothorax. Bones unremarkable. IMPRESSION: Bronchitic changes with RIGHT basilar atelectasis. Electronically Signed   By: Ulyses Southward M.D.   On: 07/15/2016 18:27    Assessment and Plan:   1. Chest pain, may have been incidental neg. For MI, but with risk factors stress test is reasonable, as pt lives out of town and works further away will do exercise [employer] today.  They are able to do and pt is NPO.  If neg may be discharged.  2. HTN, control BP-  Today 152/89 with decrease of lisinopril and HR to 55 at times add amlodipine.  MD to see.  3. Tobacco use - stopped 14 years ago.       4. CKD 3 is on ACE- was lisinopril 80 mg now down to 40 starting today,given IV fluids overnight with improvement.   5. HLD lipids good  On zetia, would change to statin if nuc is +  6. Ear ache per IM.       Signed, Nada Boozer, NP  07/16/2016 11:13 AM    Attending note:  Patient seen and examined. Reviewed available records and discussed the case with Ms. Annie Paras NP, I agree with her above assessment. Mr. Torre presents with history of hypertension, hyperlipidemia, family history of heart disease in his father, but no personal history of CAD or myocardial infarction, reports a normal stress test approximately 10-11 years ago. He has been having recent trouble with left ear pain related to excess wax build up, however has also been experiencing intermittent left-sided neck pain and chest discomfort. He works in Retail banker at [employer], fairly physical activity. He has noticed neck and chest discomfort in the last several days, reports having "good days and bad days."  On examination he appears comfortable this morning, no chest pain. Systolic blood pressure 140s. Heart  rate in the 60s in sinus rhythm by telemetry. Lungs are clear without labored breathing. Cardiac exam reveals RRR without gallop. He has no peripheral edema. Lab work shows creatinine 1.62 down to 1.44 with hydration, potassium 4.3, normal troponin I levels, LDL 76, hemoglobin 12.9. I personally reviewed his ECG from 07/16/2016 which shows normal sinus rhythm. This x-ray shows some bronchitic changes with right basilar atelectasis.  Patient presents with left-sided chest discomfort and neck pain, typical and atypical features for angina. He has ruled out for ACS and ECG is nonacute. In light of cardiac risk factor profile, we will proceed with an exercise Myoview, he is already nothing by mouth.  Jonelle Sidle, M.D., F.A.C.C.

## 2016-07-17 NOTE — ED Provider Notes (Signed)
  [facility]   [id number] 07/15/16 Arrival Time: 1647  ASSESSMENT & PLAN:  Today you were diagnosed with the following: 1. Chest pressure   2. Otalgia, left    Discussed concern over the chest pressure and associated symptoms. Sent directly to Emergency Department for evaluation. Stable upon discharge.  SUBJECTIVE:  Janett Billowodd Novitsky is a 61 y.o. male who presents with complaint of otalgia of L ear for 1-2 weeks. Decreased hearing. Also describes feeling "wiped out" for the past two weeks. Off/on. Decreased energy. Mentions chest pressure described as dull which seems to be present when he feels so fatigued. No SOB or diaphoresis or specific jaw pain associated. No nausea. No h/o GERD. Has been monitoring BP lately and says that it has been elevated. No reported h/o heart disease. Quit smoking approx 14 years ago after having smoked for 20 years. No illicit drug use.  ROS: As per HPI. All other systems negative.   OBJECTIVE:  Vitals:   07/15/16 1708  BP: (!) 165/105  Pulse: 80  Resp: 18  Temp: 98.1 F (36.7 C)  TempSrc: Oral  SpO2: 97%     General appearance: alert, cooperative, appears stated age and no distress Head: normocephalic; atraumatic Eyes: conjunctivae normal; EOMI. Ears: left EAC with much wax Nose: Nares normal. Mucosa normal. No drainage or sinus tenderness. Throat: lips, mucosa, and tongue normal; teeth and gums normal Neck: supple; symmetrical; trachea midline Lungs: clear to auscultation bilaterally Heart: regular rate and rhythm Abdomen: soft, non-tender Extremities: extremities normal, atraumatic, no cyanosis or edema Skin: warm and dry; no rashes or lesions Neurologic: Alert and oriented X 3; normal symmetric reflexes; normal gait Psychological:  Alert and cooperative. Normal mood and affect  No Known Allergies  PMHx, SurgHx, SocialHx, Medications, and Allergies were reviewed in the Visit Navigator and updated as appropriate.         Mardella LaymanHagler, Kineta Fudala, MD 07/17/16 1200

## 2019-02-25 IMAGING — DX DG CHEST 2V
2 series · 2 of 2 positions shown · non-contrast
Comparison: 10/11/2007

CLINICAL DATA: Intermittent pain at LEFT-sided jaw into LEFT neck
and LEFT-sided chest beginning 2 weeks ago, hypertension, former
smoker

EXAM:
CHEST  2 VIEW

[w chest pa]
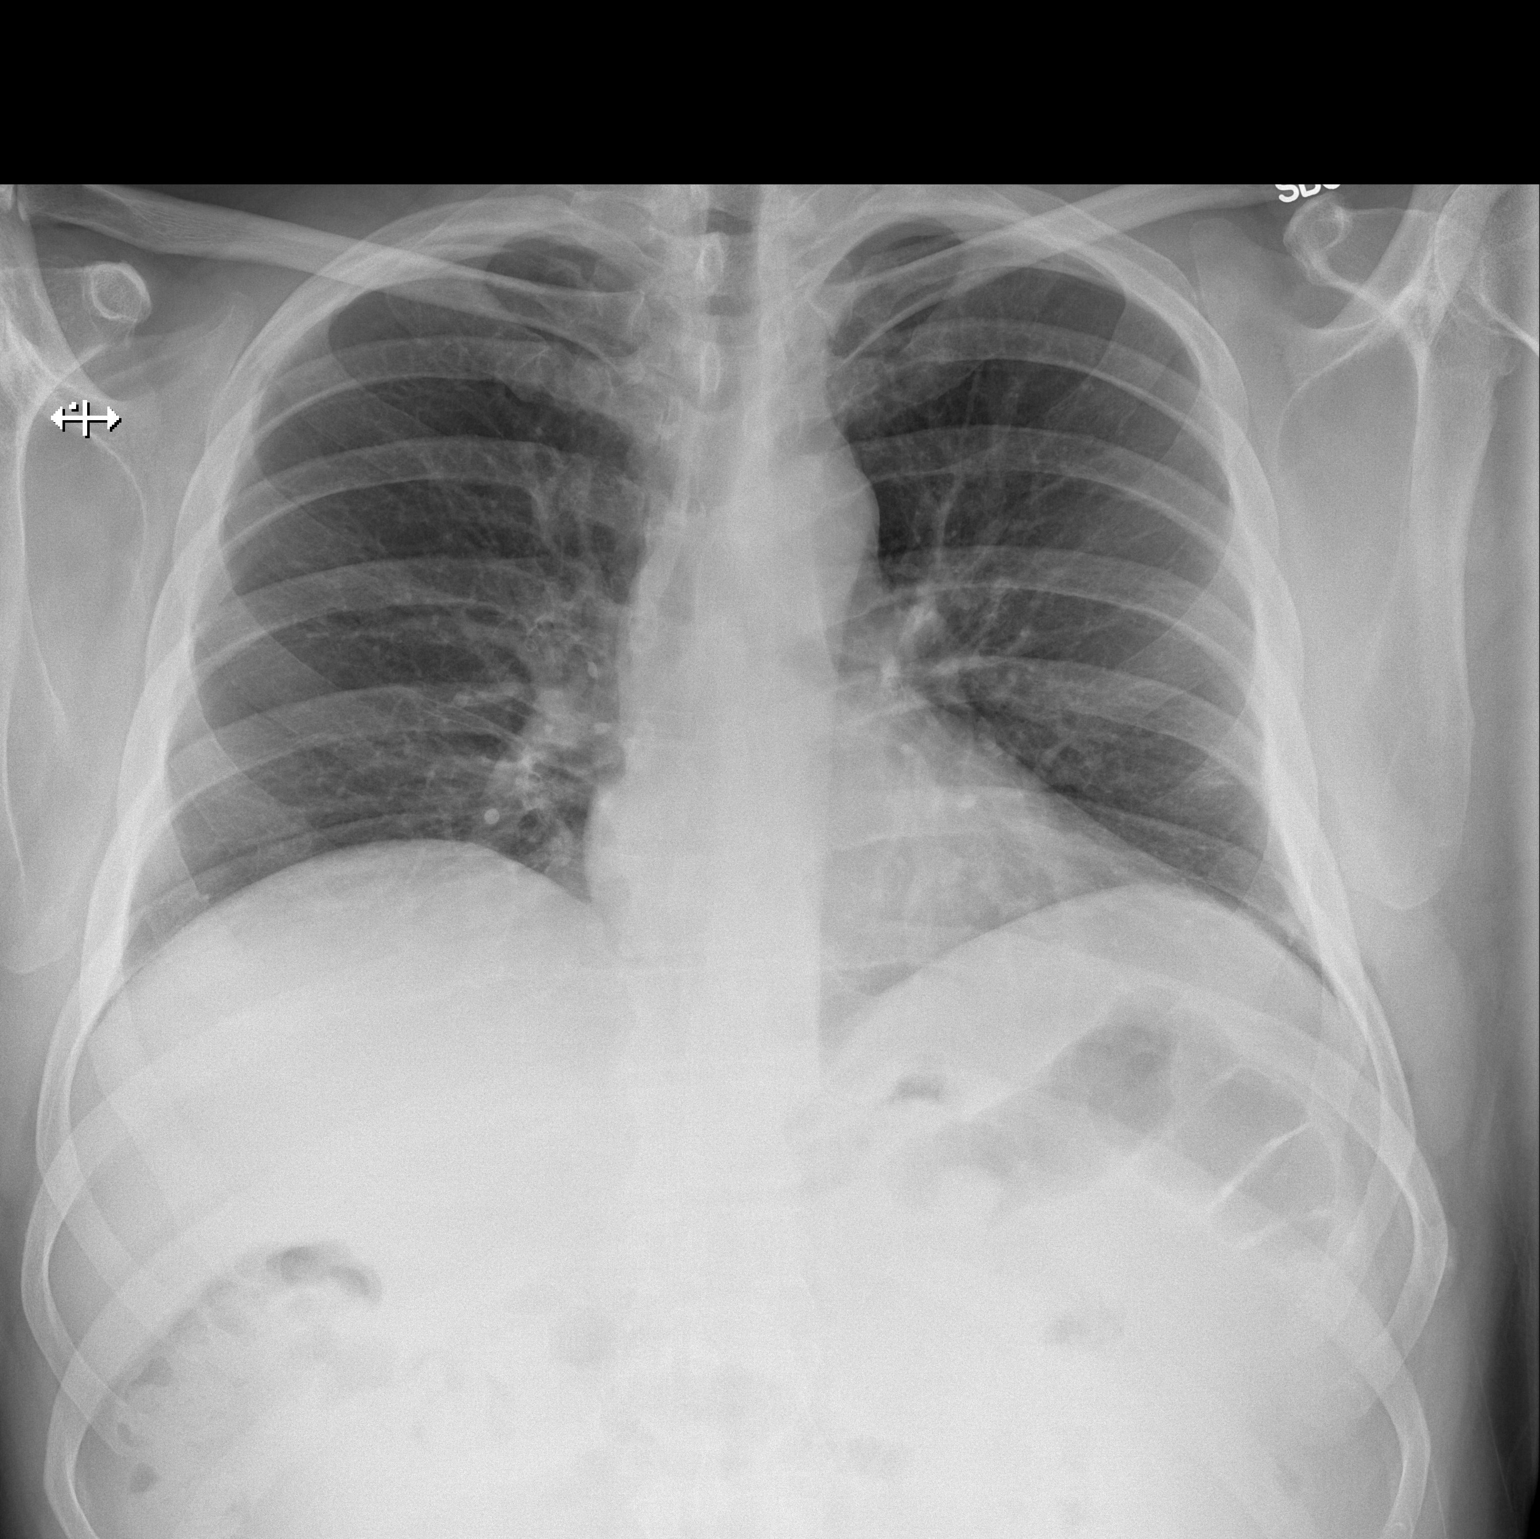

[w chest lat]
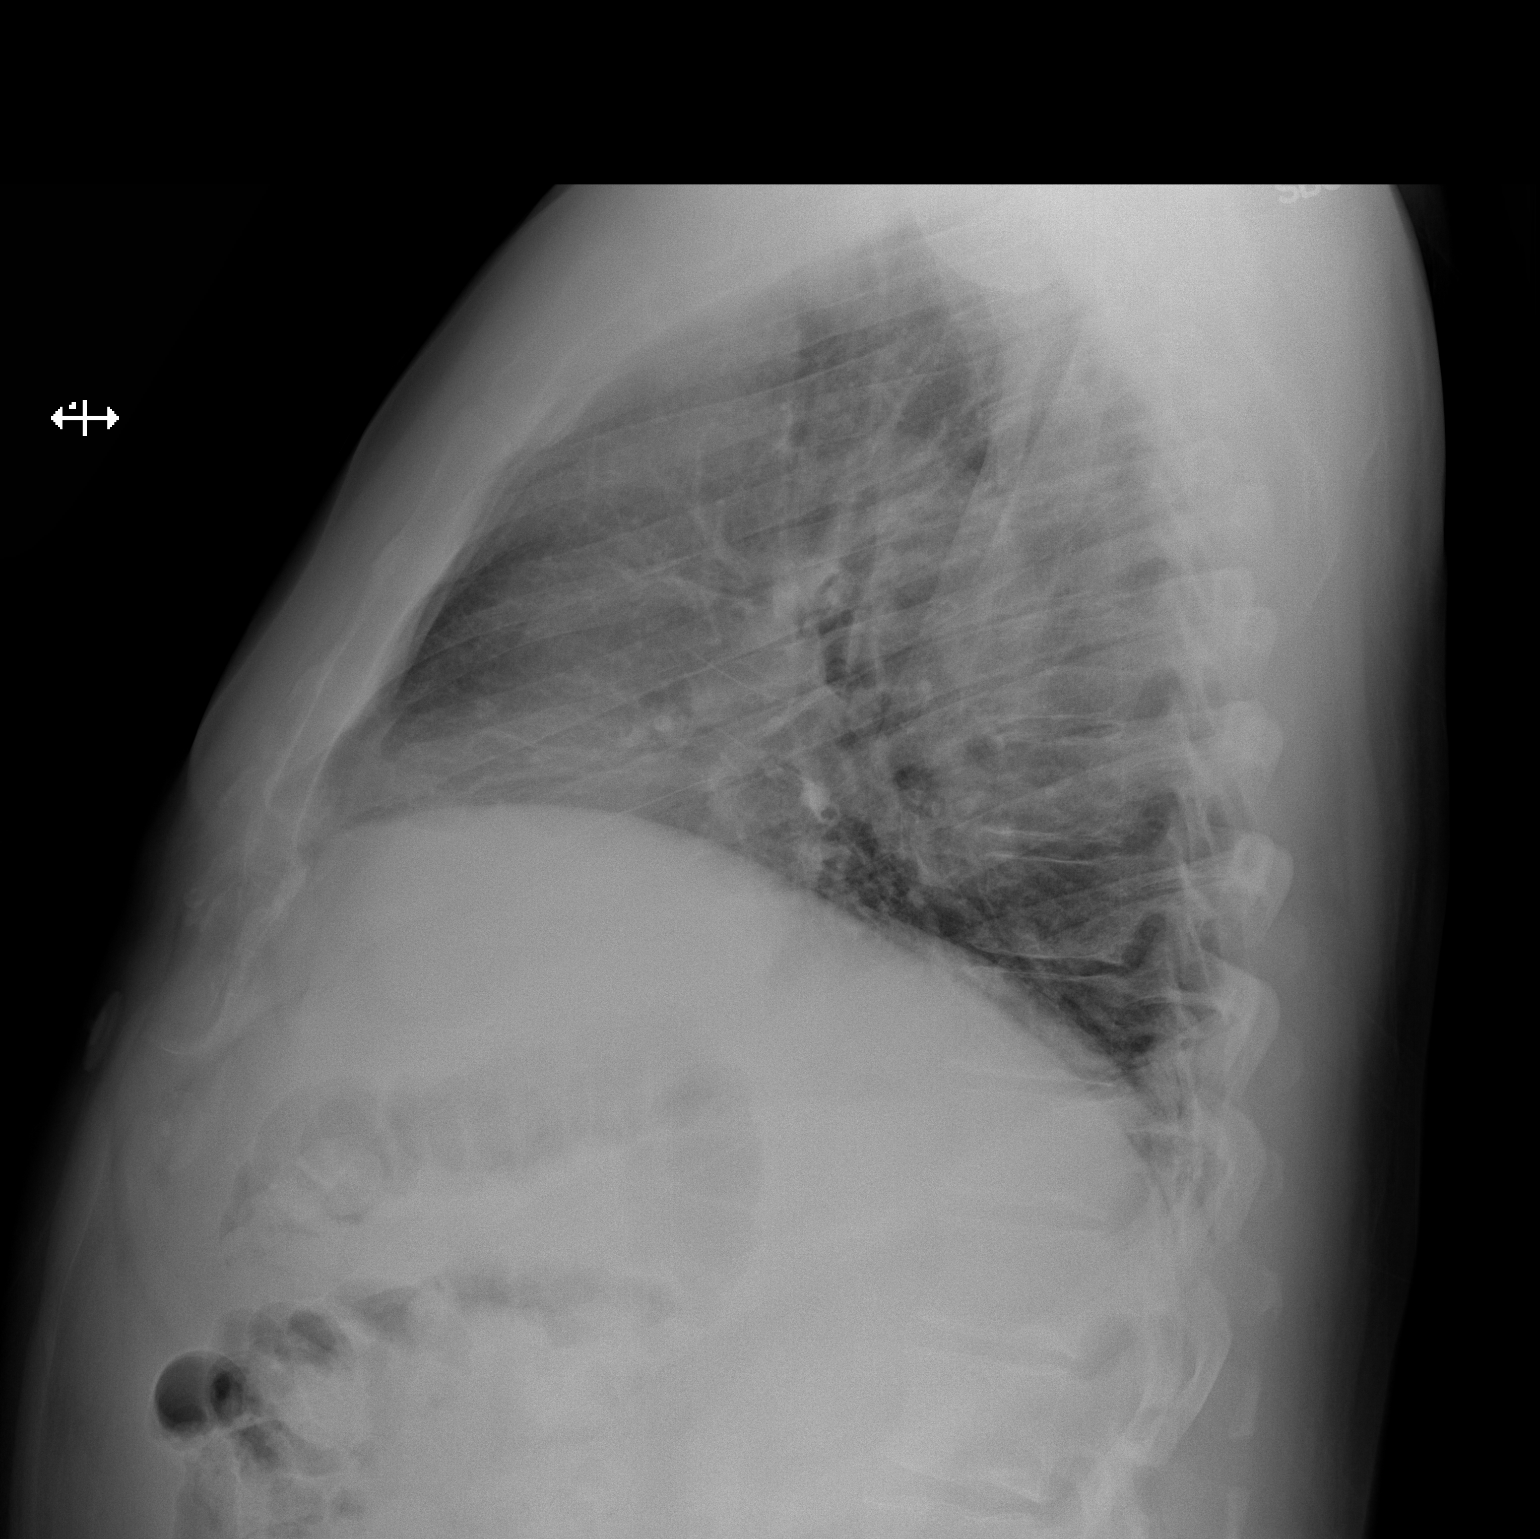

[2 of 2 positions shown; findings below may reference images not displayed]

FINDINGS: Normal heart size, mediastinal contours, and pulmonary vascularity.

Bronchitic changes with minimal RIGHT basilar atelectasis.

Lungs otherwise clear.

No acute infiltrate, pleural effusion or pneumothorax.

Bones unremarkable.
IMPRESSION: Bronchitic changes with RIGHT basilar atelectasis.
